# Patient Record
Sex: Female | Born: 1960 | Race: White | Hispanic: No | Marital: Married | Smoking: Never smoker
Health system: Southern US, Community
[De-identification: ages and names within clinical notes are randomized; demographics above are authoritative.]

## PROBLEM LIST (undated history)

## (undated) DIAGNOSIS — M329 Systemic lupus erythematosus, unspecified: Secondary | ICD-10-CM

## (undated) DIAGNOSIS — IMO0002 Reserved for concepts with insufficient information to code with codable children: Secondary | ICD-10-CM

## (undated) DIAGNOSIS — I82409 Acute embolism and thrombosis of unspecified deep veins of unspecified lower extremity: Secondary | ICD-10-CM

## (undated) HISTORY — PX: INTUSSUSCEPTION REPAIR: SHX1847

---

## 2002-08-17 HISTORY — PX: BREAST BIOPSY: SHX20

## 2008-08-06 ENCOUNTER — Ambulatory Visit: Payer: Self-pay

## 2010-11-25 ENCOUNTER — Ambulatory Visit: Payer: Self-pay | Admitting: Family Medicine

## 2011-02-16 ENCOUNTER — Ambulatory Visit: Payer: Self-pay | Admitting: Family Medicine

## 2011-02-25 ENCOUNTER — Ambulatory Visit: Payer: Self-pay | Admitting: Family Medicine

## 2011-04-29 ENCOUNTER — Ambulatory Visit: Payer: Self-pay | Admitting: Family Medicine

## 2011-07-22 ENCOUNTER — Encounter: Payer: Self-pay | Admitting: Family Medicine

## 2011-08-18 ENCOUNTER — Encounter: Payer: Self-pay | Admitting: Family Medicine

## 2011-08-20 LAB — PROTIME-INR
INR: 2
Prothrombin Time: 22 secs — ABNORMAL HIGH (ref 11.5–14.7)

## 2011-09-17 LAB — PROTIME-INR: INR: 2.8

## 2011-09-18 ENCOUNTER — Encounter: Payer: Self-pay | Admitting: Family Medicine

## 2011-10-02 ENCOUNTER — Ambulatory Visit: Payer: Self-pay | Admitting: Unknown Physician Specialty

## 2011-10-02 LAB — PROTIME-INR
INR: 2
Prothrombin Time: 22.8 secs — ABNORMAL HIGH (ref 11.5–14.7)

## 2011-10-08 ENCOUNTER — Ambulatory Visit: Payer: Self-pay | Admitting: Unknown Physician Specialty

## 2011-10-09 ENCOUNTER — Ambulatory Visit: Payer: Self-pay | Admitting: Unknown Physician Specialty

## 2011-10-14 ENCOUNTER — Ambulatory Visit: Payer: Self-pay | Admitting: Unknown Physician Specialty

## 2011-10-14 LAB — PROTIME-INR: INR: 1.6

## 2012-04-05 ENCOUNTER — Ambulatory Visit: Payer: Self-pay | Admitting: Family Medicine

## 2013-09-05 ENCOUNTER — Ambulatory Visit: Payer: Self-pay | Admitting: Family Medicine

## 2014-08-15 ENCOUNTER — Ambulatory Visit: Payer: Self-pay | Admitting: Family Medicine

## 2015-03-01 ENCOUNTER — Other Ambulatory Visit: Payer: Self-pay | Admitting: Family Medicine

## 2015-03-01 DIAGNOSIS — Z1231 Encounter for screening mammogram for malignant neoplasm of breast: Secondary | ICD-10-CM

## 2015-03-05 ENCOUNTER — Ambulatory Visit
Admission: RE | Admit: 2015-03-05 | Discharge: 2015-03-05 | Disposition: A | Discharge status: Home / Self Care | Payer: No Typology Code available for payment source | Source: Ambulatory Visit | Attending: Family Medicine | Admitting: Family Medicine

## 2015-03-05 DIAGNOSIS — Z1231 Encounter for screening mammogram for malignant neoplasm of breast: Secondary | ICD-10-CM | POA: Diagnosis not present

## 2016-02-12 ENCOUNTER — Other Ambulatory Visit: Payer: Self-pay | Admitting: Family Medicine

## 2016-02-12 DIAGNOSIS — Z1231 Encounter for screening mammogram for malignant neoplasm of breast: Secondary | ICD-10-CM

## 2016-02-27 ENCOUNTER — Ambulatory Visit
Admission: RE | Admit: 2016-02-27 | Discharge: 2016-02-27 | Disposition: A | Discharge status: Home / Self Care | Payer: BC Managed Care – PPO | Source: Ambulatory Visit | Attending: Family Medicine | Admitting: Family Medicine

## 2016-02-27 DIAGNOSIS — Z1231 Encounter for screening mammogram for malignant neoplasm of breast: Secondary | ICD-10-CM

## 2016-07-06 ENCOUNTER — Ambulatory Visit
Admission: EM | Admit: 2016-07-06 | Discharge: 2016-07-06 | Disposition: A | Discharge status: Home / Self Care | Payer: BC Managed Care – PPO | Attending: Family Medicine | Admitting: Family Medicine

## 2016-07-06 DIAGNOSIS — J069 Acute upper respiratory infection, unspecified: Secondary | ICD-10-CM

## 2016-07-06 DIAGNOSIS — B9789 Other viral agents as the cause of diseases classified elsewhere: Secondary | ICD-10-CM | POA: Diagnosis not present

## 2016-07-06 HISTORY — DX: Systemic lupus erythematosus, unspecified: M32.9

## 2016-07-06 HISTORY — DX: Acute embolism and thrombosis of unspecified deep veins of unspecified lower extremity: I82.409

## 2016-07-06 HISTORY — DX: Reserved for concepts with insufficient information to code with codable children: IMO0002

## 2016-07-06 MED ORDER — AMOXICILLIN 875 MG PO TABS: 875.0000 mg | ORAL_TABLET | Freq: Two times a day (BID) | ORAL | 0 refills | Status: DC

## 2016-07-06 NOTE — ED Triage Notes (Addendum)
Pt c/o cough for about 4 weeks, and sore throat that comes and goes. Lots of post nasal drainage.

## 2016-07-06 NOTE — ED Provider Notes (Addendum)
[facility]    CSN: [id number] Arrival date & time: 07/06/16  1702     History   Chief Complaint Chief Complaint  Patient presents with  . Cough    HPI Alicia Galvan is a 55 y.o. female.   The history is provided by the patient.  Cough  Associated symptoms: fever and headaches   URI  Presenting symptoms: congestion, cough, facial pain and fever   Severity:  Moderate Onset quality:  Sudden Duration:  2 weeks Timing:  Constant Progression:  Worsening Chronicity:  New Relieved by:  Nothing Ineffective treatments:  OTC medications Associated symptoms: headaches and sinus pain   Risk factors: immunosuppression   Risk factors: not elderly, no chronic cardiac disease, no chronic kidney disease, no chronic respiratory disease, no diabetes mellitus, no recent illness, no recent travel and no sick contacts     Past Medical History:  Diagnosis Date  . DVT (deep venous thrombosis) (HCC)   . Lupus     There are no active problems to display for this patient.   Past Surgical History:  Procedure Laterality Date  . BREAST BIOPSY Right 2004   bx/clip-neg  . CESAREAN SECTION    . INTUSSUSCEPTION REPAIR      OB History    No data available       Home Medications    Prior to Admission medications   Medication Sig Start Date End Date Taking? Authorizing Provider  folic acid (FOLVITE) 800 MCG tablet Take 400 mcg by mouth daily.   Yes Historical Provider, MD  hydroxychloroquine (PLAQUENIL) 200 MG tablet Take by mouth daily.   Yes Historical Provider, MD  sertraline (ZOLOFT) 50 MG tablet Take 50 mg by mouth daily.   Yes Historical Provider, MD  simvastatin (ZOCOR) 40 MG tablet Take 40 mg by mouth daily.   Yes Historical Provider, MD  warfarin (COUMADIN) 4 MG tablet Take 4 mg by mouth daily.   Yes Historical Provider, MD  amoxicillin (AMOXIL) 875 MG tablet Take 1 tablet (875 mg total) by mouth 2 (two) times daily. 07/06/16   Payton Mccallumrlando Delson Dulworth, MD    Family  History Family History  Problem Relation Age of Onset  . Breast cancer Neg Hx     Social History Social History  Substance Use Topics  . Smoking status: Never Smoker  . Smokeless tobacco: Never Used  . Alcohol use No     Allergies   Omnipaque [iohexol]   Review of Systems Review of Systems  Constitutional: Positive for fever.  HENT: Positive for congestion and sinus pain.   Respiratory: Positive for cough.   Neurological: Positive for headaches.     Physical Exam Triage Vital Signs ED Triage Vitals  Enc Vitals Group     BP 07/06/16 1739 122/67     Pulse Rate 07/06/16 1739 90     Resp 07/06/16 1739 18     Temp 07/06/16 1739 97.8 F (36.6 C)     Temp Source 07/06/16 1739 Oral     SpO2 07/06/16 1739 98 %     Weight 07/06/16 1742 162 lb (73.5 kg)     Height 07/06/16 1742 5' (1.524 m)     Head Circumference --      Peak Flow --      Pain Score 07/06/16 1744 4     Pain Loc --      Pain Edu? --      Excl. in GC? --    No data found.  Updated Vital Signs BP 122/67 (BP Location: Right Arm)   Pulse 90   Temp 97.8 F (36.6 C) (Oral)   Resp 18   Ht 5' (1.524 m)   Wt 162 lb (73.5 kg)   SpO2 98%   BMI 31.64 kg/m   Visual Acuity Right Eye Distance:   Left Eye Distance:   Bilateral Distance:    Right Eye Near:   Left Eye Near:    Bilateral Near:     Physical Exam  Constitutional: She appears well-developed and well-nourished. No distress.  HENT:  Head: Normocephalic and atraumatic.  Right Ear: Tympanic membrane, external ear and ear canal normal.  Left Ear: Tympanic membrane, external ear and ear canal normal.  Nose: Mucosal edema and rhinorrhea present. No nose lacerations, sinus tenderness, nasal deformity, septal deviation or nasal septal hematoma. No epistaxis.  No foreign bodies. Right sinus exhibits no maxillary sinus tenderness and no frontal sinus tenderness. Left sinus exhibits no maxillary sinus tenderness and no frontal sinus tenderness.    Mouth/Throat: Uvula is midline, oropharynx is clear and moist and mucous membranes are normal. No oropharyngeal exudate.  Eyes: Conjunctivae and EOM are normal. Pupils are equal, round, and reactive to light. Right eye exhibits no discharge. Left eye exhibits no discharge. No scleral icterus.  Neck: Normal range of motion. Neck supple. No thyromegaly present.  Cardiovascular: Normal rate, regular rhythm and normal heart sounds.   Pulmonary/Chest: Effort normal and breath sounds normal. No respiratory distress. She has no wheezes. She has no rales.  Lymphadenopathy:    She has no cervical adenopathy.  Skin: She is not diaphoretic.  Nursing note and vitals reviewed.    UC Treatments / Results  Labs (all labs ordered are listed, but only abnormal results are displayed) Labs Reviewed - No data to display  EKG  EKG Interpretation None       Radiology No results found.  Procedures Procedures (including critical care time)  Medications Ordered in UC Medications - No data to display   Initial Impression / Assessment and Plan / UC Course  I have reviewed the triage vital signs and the nursing notes.  Pertinent labs & imaging results that were available during my care of the patient were reviewed by me and considered in my medical decision making (see chart for details).  Clinical Course       Final Clinical Impressions(s) / UC Diagnoses   Final diagnoses:  Viral URI with cough    New Prescriptions Discharge Medication List as of 07/06/2016  6:06 PM    START taking these medications   Details  amoxicillin (AMOXIL) 875 MG tablet Take 1 tablet (875 mg total) by mouth 2 (two) times daily., Starting Mon 07/06/2016, Print       1. Lab results and diagnosis reviewed with patient 2. rx as per orders above; reviewed possible side effects, interactions, risks and benefits; rx printed for patient to hold on to and monitor symptoms; if worsening over the next several day may  start taking 3. Recommend supportive treatment with rest, fluids  4. Follow-up prn if symptoms worsen or don't improve   Payton Mccallumrlando Stacy Sailer, MD 07/06/16 2008    Payton Mccallumrlando Boleslaus Holloway, MD 07/06/16 2011

## 2017-02-08 ENCOUNTER — Other Ambulatory Visit: Payer: Self-pay | Admitting: Family Medicine

## 2017-02-08 DIAGNOSIS — Z1231 Encounter for screening mammogram for malignant neoplasm of breast: Secondary | ICD-10-CM

## 2017-03-04 ENCOUNTER — Inpatient Hospital Stay: Admission: RE | Admit: 2017-03-04 | Payer: BC Managed Care – PPO | Source: Ambulatory Visit

## 2017-03-04 ENCOUNTER — Ambulatory Visit
Admission: RE | Admit: 2017-03-04 | Discharge: 2017-03-04 | Disposition: A | Discharge status: Home / Self Care | Payer: BC Managed Care – PPO | Source: Ambulatory Visit | Attending: Family Medicine | Admitting: Family Medicine

## 2017-03-04 DIAGNOSIS — Z1231 Encounter for screening mammogram for malignant neoplasm of breast: Secondary | ICD-10-CM | POA: Insufficient documentation

## 2017-09-25 ENCOUNTER — Other Ambulatory Visit: Payer: Self-pay

## 2017-09-25 ENCOUNTER — Ambulatory Visit
Admission: EM | Admit: 2017-09-25 | Discharge: 2017-09-25 | Disposition: A | Discharge status: Home / Self Care | Payer: BC Managed Care – PPO | Attending: Family Medicine | Admitting: Family Medicine

## 2017-09-25 DIAGNOSIS — R69 Illness, unspecified: Secondary | ICD-10-CM

## 2017-09-25 DIAGNOSIS — R0981 Nasal congestion: Secondary | ICD-10-CM

## 2017-09-25 DIAGNOSIS — R05 Cough: Secondary | ICD-10-CM

## 2017-09-25 DIAGNOSIS — J111 Influenza due to unidentified influenza virus with other respiratory manifestations: Secondary | ICD-10-CM

## 2017-09-25 DIAGNOSIS — M791 Myalgia, unspecified site: Secondary | ICD-10-CM | POA: Diagnosis not present

## 2017-09-25 MED ORDER — OSELTAMIVIR PHOSPHATE 75 MG PO CAPS: 75.0000 mg | ORAL_CAPSULE | Freq: Two times a day (BID) | ORAL | 0 refills | Status: AC

## 2017-09-25 NOTE — Discharge Instructions (Signed)
Take medication as prescribed. Rest. Drink plenty of fluids.  ° °Follow up with your primary care physician this week as needed. Return to Urgent care for new or worsening concerns.  ° °

## 2017-09-25 NOTE — ED Triage Notes (Signed)
Patient c/o cough, fever, fatigue and congestion x 3 days.

## 2017-09-25 NOTE — ED Provider Notes (Signed)
[facility] ____________________________________________  Time seen: Approximately 4:00 PM  I have reviewed the triage vital signs and the nursing notes.   HISTORY  Chief Complaint Cough   HPI Alicia Galvan is a 57 y.o. female presenting for evaluation of runny nose, nasal congestion, cough, chills, body aches onset Thursday night.  Reports did have a low-grade fever near 101.  States has taken some over-the-counter cough congestion medications.  Reports continues to eat and drink well.  Denies sore throat.  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  States cough did disrupt sleep some, but reports she is going to start taking some NyQuil which in the past helps her cough well.  Has continue to remain active.  States does feel somewhat tired from not sleeping well.  Denies other complaints. Denies chest pain, shortness of breath, abdominal pain, or rash. Denies recent sickness. Denies recent antibiotic use.  Denies renal insufficiency.  Rolm GalaGrandis, Heidi, MD: PCP No LMP recorded. Patient is postmenopausal.   Past Medical History:  Diagnosis Date  . DVT (deep venous thrombosis) (HCC)   . Lupus     There are no active problems to display for this patient.   Past Surgical History:  Procedure Laterality Date  . BREAST BIOPSY Right 2004   bx/clip-neg  . CESAREAN SECTION    . INTUSSUSCEPTION REPAIR       No current facility-administered medications for this encounter.   Current Outpatient Medications:  .  apixaban (ELIQUIS) 5 MG TABS tablet, Take 5 mg by mouth 2 (two) times daily., Disp: , Rfl:  .  cetirizine (ZYRTEC) 10 MG tablet, Take 10 mg by mouth daily., Disp: , Rfl:  .  folic acid (FOLVITE) 800 MCG tablet, Take 400 mcg by mouth daily., Disp: , Rfl:  .  hydroxychloroquine (PLAQUENIL) 200 MG tablet, Take by mouth 2 (two) times daily. , Disp: , Rfl:  .  Multiple Vitamin (MULTIVITAMIN)  capsule, Take 1 capsule by mouth daily., Disp: , Rfl:  .  sertraline (ZOLOFT) 50 MG tablet, Take 50 mg by mouth daily., Disp: , Rfl:  .  simvastatin (ZOCOR) 40 MG tablet, Take 40 mg by mouth daily., Disp: , Rfl:  .  oseltamivir (TAMIFLU) 75 MG capsule, Take 1 capsule (75 mg total) by mouth every 12 (twelve) hours., Disp: 10 capsule, Rfl: 0  Allergies Omnipaque [iohexol]  Family History  Problem Relation Age of Onset  . Breast cancer Neg Hx     Social History Social History   Tobacco Use  . Smoking status: Never Smoker  . Smokeless tobacco: Never Used  Substance Use Topics  . Alcohol use: No  . Drug use: No    Review of Systems Constitutional: As above.  ENT: No sore throat. As above.  Cardiovascular: Denies chest pain. Respiratory: Denies shortness of breath. Gastrointestinal: No abdominal pain.  Musculoskeletal: Negative for back pain. Skin: Negative for rash.   ____________________________________________   PHYSICAL EXAM:  VITAL SIGNS: ED Triage Vitals  Enc Vitals Group     BP 09/25/17 1530 134/69     Pulse Rate 09/25/17 1530 (!) 106     Resp -- 18     Temp 09/25/17 1530 98.1 F (36.7 C)     Temp Source 09/25/17 1530 Oral     SpO2 09/25/17 1530 98 %     Weight 09/25/17 1531 166 lb (75.3 kg)     Height 09/25/17 1531 5'  0.5" (1.537 m)     Head Circumference --      Peak Flow --      Pain Score 09/25/17 1531 1     Pain Loc --      Pain Edu? --      Excl. in GC? --     Constitutional: Alert and oriented. Well appearing and in no acute distress. Eyes: Conjunctivae are normal.  Head: Atraumatic. No sinus tenderness to palpation. No swelling. No erythema.  Ears: no erythema, normal TMs bilaterally.   Nose:Nasal congestion with clear rhinorrhea  Mouth/Throat: Mucous membranes are moist. No pharyngeal erythema. No tonsillar swelling or exudate.  Neck: No stridor.  No cervical spine tenderness to palpation. Hematological/Lymphatic/Immunilogical: No cervical  lymphadenopathy. Cardiovascular: Normal rate, regular rhythm. Grossly normal heart sounds.  Good peripheral circulation. Respiratory: Normal respiratory effort.  No retractions. No wheezes, rales or rhonchi. Good air movement.  Dry intermittent cough noted in room. Musculoskeletal: Ambulatory with steady gait. No cervical, thoracic or lumbar tenderness to palpation. Neurologic:  Normal speech and language. No gait instability. Skin:  Skin appears warm, dry and intact. No rash noted. Psychiatric: Mood and affect are normal. Speech and behavior are normal.  ___________________________________________   LABS (all labs ordered are listed, but only abnormal results are displayed)  Labs Reviewed - No data to display ____________________________________________   PROCEDURES Procedures     INITIAL IMPRESSION / ASSESSMENT AND PLAN / ED COURSE  Pertinent labs & imaging results that were available during my care of the patient were reviewed by me and considered in my medical decision making (see chart for details).  Well-appearing patient.  No acute distress.  Suspect influenza-like illness.  Discussed use of Tamiflu, Rx given.  Patient declines need for prescription cough medication.  Encouraged rest, fluids, supportive care.  Discussed follow up with Primary care physician this week. Discussed follow up and return parameters including no resolution or any worsening concerns. Patient verbalized understanding and agreed to plan.   ____________________________________________   FINAL CLINICAL IMPRESSION(S) / ED DIAGNOSES  Final diagnoses:  Influenza-like illness     ED Discharge Orders        Ordered    oseltamivir (TAMIFLU) 75 MG capsule  Every 12 hours     09/25/17 1618       Note: This dictation was prepared with Dragon dictation along with smaller phrase technology. Any transcriptional errors that result from this process are unintentional.         Renford Dills,  NP 09/25/17 1651

## 2017-09-28 ENCOUNTER — Telehealth: Payer: Self-pay | Admitting: Emergency Medicine

## 2017-09-28 NOTE — Telephone Encounter (Signed)
Called to follow up after patient's recent visit. Left message to call with any questions. 

## 2018-03-02 ENCOUNTER — Other Ambulatory Visit: Payer: Self-pay | Admitting: Family Medicine

## 2018-03-02 DIAGNOSIS — Z1231 Encounter for screening mammogram for malignant neoplasm of breast: Secondary | ICD-10-CM

## 2018-03-09 ENCOUNTER — Encounter (INDEPENDENT_AMBULATORY_CARE_PROVIDER_SITE_OTHER): Payer: Self-pay

## 2018-03-09 ENCOUNTER — Ambulatory Visit
Admission: RE | Admit: 2018-03-09 | Discharge: 2018-03-09 | Disposition: A | Discharge status: Home / Self Care | Payer: BC Managed Care – PPO | Source: Ambulatory Visit | Attending: Family Medicine | Admitting: Family Medicine

## 2018-03-09 DIAGNOSIS — Z1231 Encounter for screening mammogram for malignant neoplasm of breast: Secondary | ICD-10-CM

## 2018-04-27 ENCOUNTER — Other Ambulatory Visit: Payer: Self-pay | Admitting: Internal Medicine

## 2018-04-27 ENCOUNTER — Ambulatory Visit
Admission: RE | Admit: 2018-04-27 | Discharge: 2018-04-27 | Disposition: A | Discharge status: Home / Self Care | Payer: BC Managed Care – PPO | Source: Ambulatory Visit | Attending: Internal Medicine | Admitting: Internal Medicine

## 2018-04-27 DIAGNOSIS — M7989 Other specified soft tissue disorders: Secondary | ICD-10-CM | POA: Diagnosis present

## 2019-03-08 ENCOUNTER — Other Ambulatory Visit: Payer: Self-pay | Admitting: Family Medicine

## 2019-03-21 ENCOUNTER — Other Ambulatory Visit: Payer: Self-pay | Admitting: Family Medicine

## 2019-03-21 DIAGNOSIS — Z1231 Encounter for screening mammogram for malignant neoplasm of breast: Secondary | ICD-10-CM

## 2019-10-15 ENCOUNTER — Ambulatory Visit: Payer: BC Managed Care – PPO | Attending: Internal Medicine

## 2019-10-15 DIAGNOSIS — Z23 Encounter for immunization: Secondary | ICD-10-CM

## 2019-10-15 NOTE — Progress Notes (Signed)
   Covid-19 Vaccination Clinic  Name:  Alicia Galvan    MRN: [id number] DOB: 1960/11/10  10/15/2019  [name] was observed post Covid-19 immunization for 15 minutes without incidence. She was provided with Vaccine Information Sheet and instruction to access the V-Safe system.   [name] was instructed to call 911 with any severe reactions post vaccine: Marland Kitchen Difficulty breathing  . Swelling of your face and throat  . A fast heartbeat  . A bad rash all over your body  . Dizziness and weakness    Immunizations Administered    Name Date Dose VIS Date Route   Pfizer COVID-19 Vaccine 10/15/2019  9:48 AM 0.3 mL 07/28/2019 Intramuscular   Manufacturer: [employer], [employer]   Lot: [id number]   NDC: [address]-2

## 2019-11-08 ENCOUNTER — Ambulatory Visit: Payer: BC Managed Care – PPO | Attending: Internal Medicine

## 2019-11-08 DIAGNOSIS — Z23 Encounter for immunization: Secondary | ICD-10-CM

## 2019-11-08 NOTE — Progress Notes (Signed)
   Covid-19 Vaccination Clinic  Name:  Alicia Galvan    MRN: [id number] DOB: Aug 11, 1961  11/08/2019  [name] was observed post Covid-19 immunization for 15 minutes without incident. She was provided with Vaccine Information Sheet and instruction to access the V-Safe system.   [name] was instructed to call 911 with any severe reactions post vaccine: Marland Kitchen Difficulty breathing  . Swelling of face and throat  . A fast heartbeat  . A bad rash all over body  . Dizziness and weakness   Immunizations Administered    Name Date Dose VIS Date Route   Pfizer COVID-19 Vaccine 11/08/2019  8:19 AM 0.3 mL 07/28/2019 Intramuscular   Manufacturer: [employer], [employer]   Lot: [id number]   NDC: [address]-9

## 2020-02-27 ENCOUNTER — Ambulatory Visit: Payer: BC Managed Care – PPO

## 2020-03-06 ENCOUNTER — Ambulatory Visit
Admission: RE | Admit: 2020-03-06 | Discharge: 2020-03-06 | Disposition: A | Discharge status: Home / Self Care | Payer: BC Managed Care – PPO | Source: Ambulatory Visit | Attending: Family Medicine | Admitting: Family Medicine

## 2020-03-06 ENCOUNTER — Other Ambulatory Visit: Payer: Self-pay

## 2020-03-06 DIAGNOSIS — Z1231 Encounter for screening mammogram for malignant neoplasm of breast: Secondary | ICD-10-CM | POA: Diagnosis present

## 2021-03-05 ENCOUNTER — Other Ambulatory Visit: Payer: Self-pay | Admitting: Family Medicine

## 2021-03-05 DIAGNOSIS — Z1231 Encounter for screening mammogram for malignant neoplasm of breast: Secondary | ICD-10-CM

## 2021-03-19 ENCOUNTER — Ambulatory Visit
Admission: RE | Admit: 2021-03-19 | Discharge: 2021-03-19 | Disposition: A | Discharge status: Home / Self Care | Payer: BC Managed Care – PPO | Source: Ambulatory Visit | Attending: Family Medicine | Admitting: Family Medicine

## 2021-03-19 ENCOUNTER — Other Ambulatory Visit: Payer: Self-pay

## 2021-03-19 DIAGNOSIS — Z1231 Encounter for screening mammogram for malignant neoplasm of breast: Secondary | ICD-10-CM | POA: Insufficient documentation

## 2021-03-25 ENCOUNTER — Other Ambulatory Visit: Payer: Self-pay | Admitting: Family Medicine

## 2021-03-25 DIAGNOSIS — R921 Mammographic calcification found on diagnostic imaging of breast: Secondary | ICD-10-CM

## 2021-03-25 DIAGNOSIS — R928 Other abnormal and inconclusive findings on diagnostic imaging of breast: Secondary | ICD-10-CM

## 2021-03-27 ENCOUNTER — Ambulatory Visit
Admission: RE | Admit: 2021-03-27 | Discharge: 2021-03-27 | Disposition: A | Discharge status: Home / Self Care | Payer: BC Managed Care – PPO | Source: Ambulatory Visit | Attending: Family Medicine | Admitting: Family Medicine

## 2021-03-27 ENCOUNTER — Other Ambulatory Visit: Payer: Self-pay

## 2021-03-27 DIAGNOSIS — R921 Mammographic calcification found on diagnostic imaging of breast: Secondary | ICD-10-CM

## 2021-03-27 DIAGNOSIS — R928 Other abnormal and inconclusive findings on diagnostic imaging of breast: Secondary | ICD-10-CM | POA: Diagnosis not present

## 2021-06-05 ENCOUNTER — Other Ambulatory Visit: Payer: Self-pay

## 2021-06-05 ENCOUNTER — Ambulatory Visit
Admission: RE | Admit: 2021-06-05 | Discharge: 2021-06-05 | Disposition: A | Discharge status: Home / Self Care | Payer: BC Managed Care – PPO | Source: Ambulatory Visit | Attending: Family Medicine | Admitting: Family Medicine

## 2021-06-05 ENCOUNTER — Other Ambulatory Visit: Payer: Self-pay | Admitting: Family Medicine

## 2021-06-05 ENCOUNTER — Ambulatory Visit
Admission: RE | Admit: 2021-06-05 | Discharge: 2021-06-05 | Disposition: A | Discharge status: Home / Self Care | Payer: BC Managed Care – PPO | Attending: Family Medicine | Admitting: Family Medicine

## 2021-06-05 DIAGNOSIS — M25511 Pain in right shoulder: Secondary | ICD-10-CM | POA: Insufficient documentation

## 2022-01-22 LAB — COLOGUARD: COLOGUARD: NEGATIVE

## 2022-03-18 ENCOUNTER — Other Ambulatory Visit: Payer: Self-pay | Admitting: Physician Assistant

## 2022-03-18 DIAGNOSIS — Z1231 Encounter for screening mammogram for malignant neoplasm of breast: Secondary | ICD-10-CM

## 2022-04-01 ENCOUNTER — Ambulatory Visit
Admission: RE | Admit: 2022-04-01 | Discharge: 2022-04-01 | Disposition: A | Discharge status: Home / Self Care | Payer: BC Managed Care – PPO | Source: Ambulatory Visit | Attending: Physician Assistant | Admitting: Physician Assistant

## 2022-04-01 DIAGNOSIS — Z1231 Encounter for screening mammogram for malignant neoplasm of breast: Secondary | ICD-10-CM | POA: Diagnosis present

## 2023-04-02 ENCOUNTER — Other Ambulatory Visit: Payer: Self-pay | Admitting: Physician Assistant

## 2023-04-02 DIAGNOSIS — R928 Other abnormal and inconclusive findings on diagnostic imaging of breast: Secondary | ICD-10-CM

## 2023-04-05 ENCOUNTER — Ambulatory Visit
Admission: RE | Admit: 2023-04-05 | Discharge: 2023-04-05 | Disposition: A | Discharge status: Home / Self Care | Payer: BC Managed Care – PPO | Source: Ambulatory Visit | Attending: Physician Assistant | Admitting: Physician Assistant

## 2023-04-05 DIAGNOSIS — Z1231 Encounter for screening mammogram for malignant neoplasm of breast: Secondary | ICD-10-CM | POA: Insufficient documentation

## 2023-04-05 DIAGNOSIS — R928 Other abnormal and inconclusive findings on diagnostic imaging of breast: Secondary | ICD-10-CM | POA: Insufficient documentation

## 2023-08-20 IMAGING — CR DG SHOULDER 2+V*R*
3 series · 3 of 3 positions shown · non-contrast
Comparison: None.

CLINICAL DATA: Right shoulder pain for 1 month

EXAM:
RIGHT SHOULDER - 2+ VIEW

[shoulder grashey]
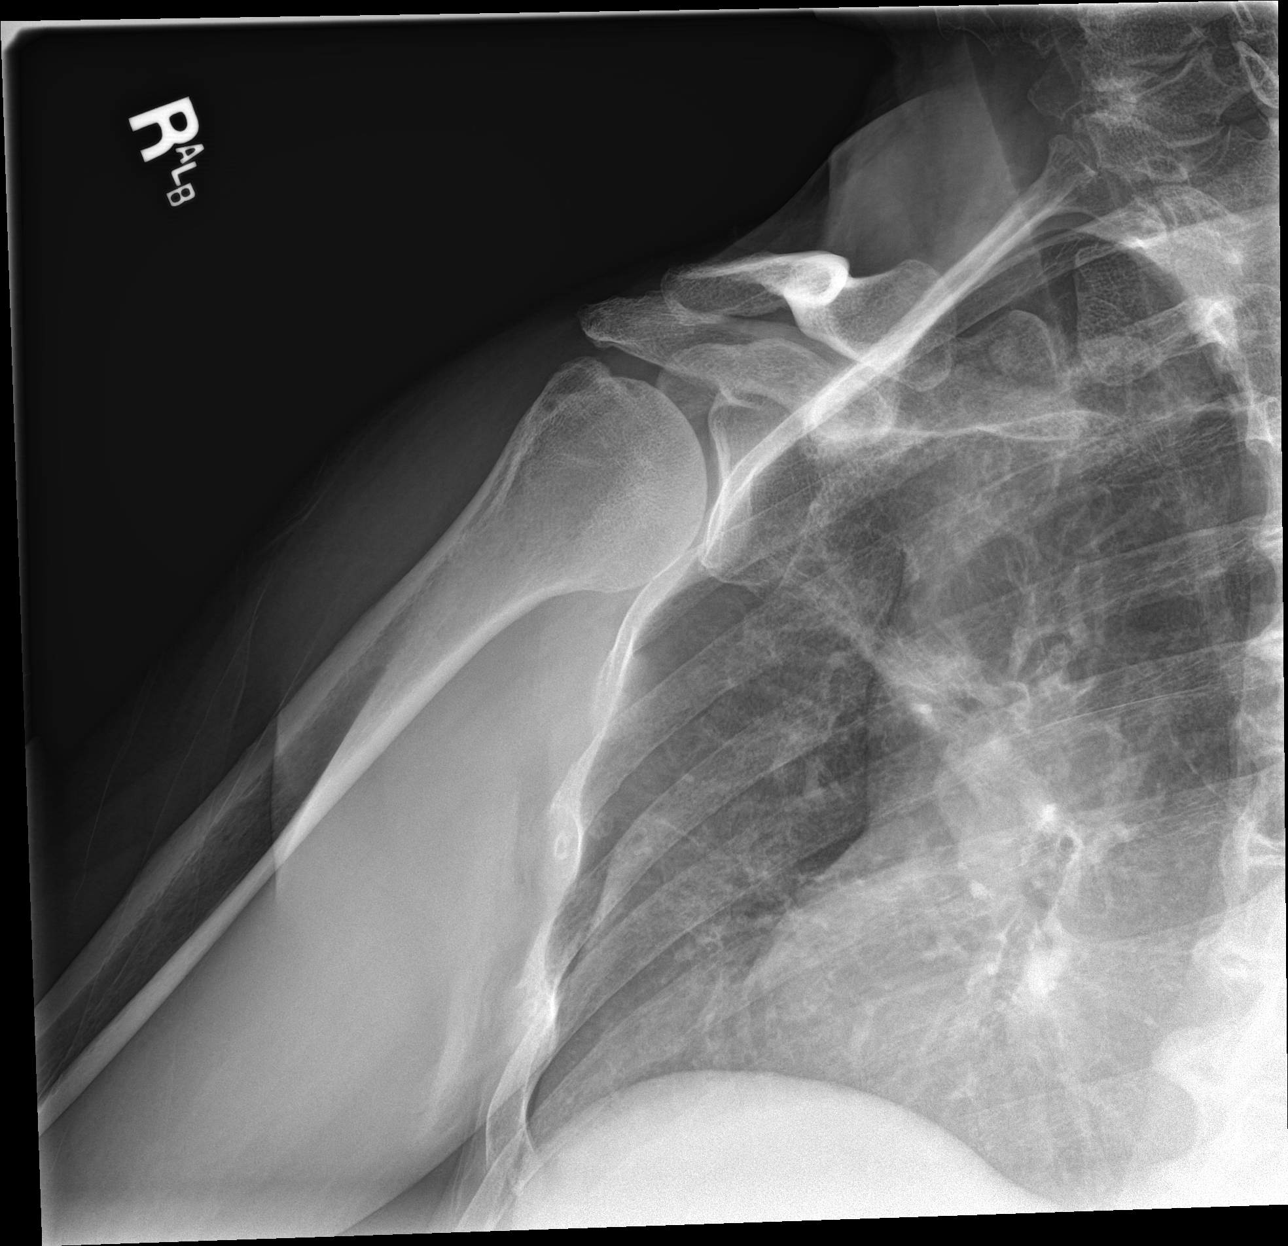

[shoulder y view]
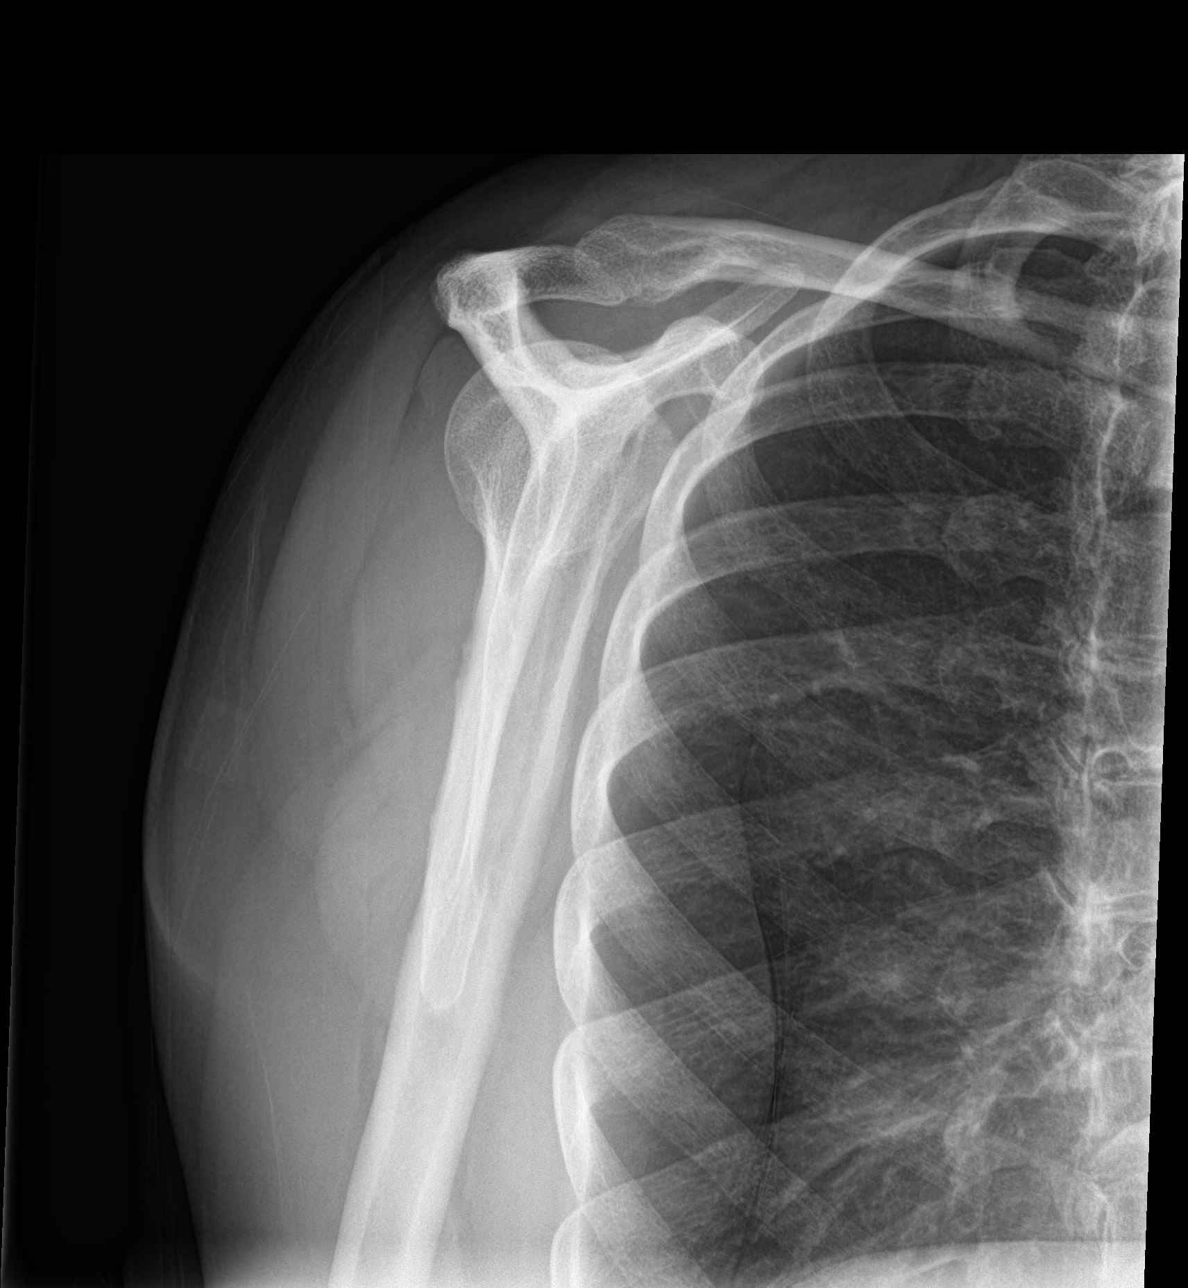

[shoulder axial]
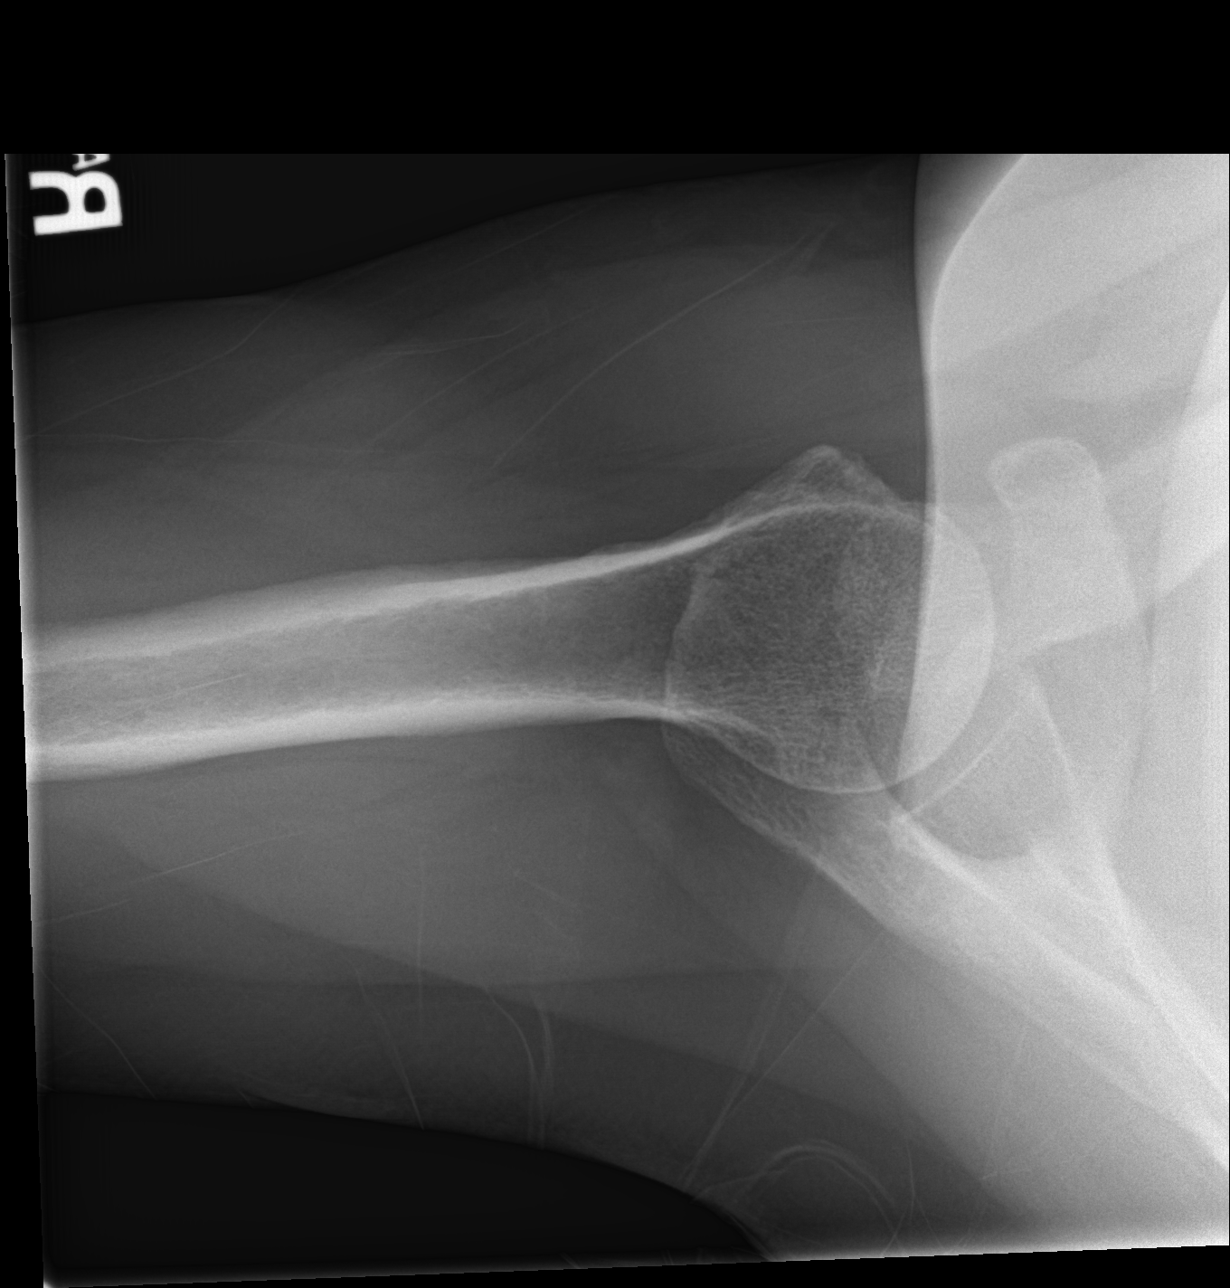

[3 of 3 positions shown; findings below may reference images not displayed]

FINDINGS: Frontal, transscapular, and axillary views of the right shoulder are
obtained. No acute fracture, subluxation, or dislocation. Joint
spaces are well preserved. Right chest is clear.
IMPRESSION: 1. Unremarkable right shoulder.

## 2024-02-21 ENCOUNTER — Other Ambulatory Visit: Payer: Self-pay | Admitting: Physician Assistant

## 2024-02-21 DIAGNOSIS — Z1231 Encounter for screening mammogram for malignant neoplasm of breast: Secondary | ICD-10-CM

## 2024-04-06 ENCOUNTER — Ambulatory Visit
Admission: RE | Admit: 2024-04-06 | Discharge: 2024-04-06 | Disposition: A | Discharge status: Home / Self Care | Payer: Self-pay | Source: Ambulatory Visit | Attending: Physician Assistant | Admitting: Physician Assistant

## 2024-04-06 DIAGNOSIS — Z1231 Encounter for screening mammogram for malignant neoplasm of breast: Secondary | ICD-10-CM | POA: Diagnosis present
